# Patient Record
Sex: Male | Born: 1970 | Race: Black or African American | Hispanic: No | Marital: Single | Smoking: Former smoker
Health system: Southern US, Community
[De-identification: ages and names within clinical notes are randomized; demographics above are authoritative.]

## PROBLEM LIST (undated history)

## (undated) DIAGNOSIS — F32A Depression, unspecified: Secondary | ICD-10-CM

## (undated) DIAGNOSIS — F329 Major depressive disorder, single episode, unspecified: Secondary | ICD-10-CM

## (undated) DIAGNOSIS — K219 Gastro-esophageal reflux disease without esophagitis: Secondary | ICD-10-CM

## (undated) DIAGNOSIS — F952 Tourette's disorder: Secondary | ICD-10-CM

## (undated) DIAGNOSIS — K259 Gastric ulcer, unspecified as acute or chronic, without hemorrhage or perforation: Secondary | ICD-10-CM

## (undated) DIAGNOSIS — F4310 Post-traumatic stress disorder, unspecified: Secondary | ICD-10-CM

## (undated) DIAGNOSIS — G4730 Sleep apnea, unspecified: Secondary | ICD-10-CM

## (undated) DIAGNOSIS — F419 Anxiety disorder, unspecified: Secondary | ICD-10-CM

## (undated) DIAGNOSIS — I10 Essential (primary) hypertension: Secondary | ICD-10-CM

## (undated) DIAGNOSIS — F988 Other specified behavioral and emotional disorders with onset usually occurring in childhood and adolescence: Secondary | ICD-10-CM

## (undated) DIAGNOSIS — E119 Type 2 diabetes mellitus without complications: Secondary | ICD-10-CM

## (undated) HISTORY — DX: Type 2 diabetes mellitus without complications: E11.9

## (undated) HISTORY — PX: ANKLE SURGERY: SHX546

---
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
## 1996-04-02 HISTORY — PX: WRIST SURGERY: SHX841

## 1998-03-23 ENCOUNTER — Inpatient Hospital Stay (HOSPITAL_COMMUNITY): Admission: AD | Admit: 1998-03-23 | Discharge: 1998-04-05 | Payer: Self-pay | Admitting: *Deleted

## 1998-04-25 ENCOUNTER — Emergency Department (HOSPITAL_COMMUNITY): Admission: EM | Admit: 1998-04-25 | Discharge: 1998-04-25 | Payer: Self-pay | Admitting: Emergency Medicine

## 1999-03-06 ENCOUNTER — Emergency Department (HOSPITAL_COMMUNITY): Admission: EM | Admit: 1999-03-06 | Discharge: 1999-03-06 | Payer: Self-pay | Admitting: Emergency Medicine

## 1999-08-21 ENCOUNTER — Encounter: Admission: RE | Admit: 1999-08-21 | Discharge: 1999-11-19 | Payer: Self-pay

## 2000-04-02 HISTORY — PX: ACHILLES TENDON REPAIR: SUR1153

## 2000-05-01 ENCOUNTER — Inpatient Hospital Stay (HOSPITAL_COMMUNITY): Admission: EM | Admit: 2000-05-01 | Discharge: 2000-05-05 | Payer: Self-pay | Admitting: Emergency Medicine

## 2000-05-01 ENCOUNTER — Encounter: Payer: Self-pay | Admitting: Emergency Medicine

## 2000-05-02 ENCOUNTER — Encounter: Payer: Self-pay | Admitting: Surgery

## 2000-05-02 ENCOUNTER — Encounter: Payer: Self-pay | Admitting: Emergency Medicine

## 2000-05-03 ENCOUNTER — Encounter: Payer: Self-pay | Admitting: General Surgery

## 2000-05-04 ENCOUNTER — Encounter: Payer: Self-pay | Admitting: Surgery

## 2000-05-05 ENCOUNTER — Encounter: Payer: Self-pay | Admitting: Surgery

## 2003-02-18 ENCOUNTER — Encounter: Admission: RE | Admit: 2003-02-18 | Discharge: 2003-02-18 | Payer: Self-pay | Admitting: *Deleted

## 2003-02-27 ENCOUNTER — Emergency Department (HOSPITAL_COMMUNITY): Admission: EM | Admit: 2003-02-27 | Discharge: 2003-02-27 | Payer: Self-pay | Admitting: *Deleted

## 2003-09-19 ENCOUNTER — Ambulatory Visit (HOSPITAL_BASED_OUTPATIENT_CLINIC_OR_DEPARTMENT_OTHER): Admission: RE | Admit: 2003-09-19 | Discharge: 2003-09-19 | Payer: Medicare Other | Admitting: Neurology

## 2003-09-29 ENCOUNTER — Ambulatory Visit (HOSPITAL_BASED_OUTPATIENT_CLINIC_OR_DEPARTMENT_OTHER): Admission: RE | Admit: 2003-09-29 | Discharge: 2003-09-29 | Payer: Self-pay | Admitting: Neurology

## 2003-09-29 ENCOUNTER — Emergency Department (HOSPITAL_COMMUNITY): Admission: EM | Admit: 2003-09-29 | Discharge: 2003-09-29 | Payer: Self-pay | Admitting: Family Medicine

## 2003-12-09 ENCOUNTER — Ambulatory Visit (HOSPITAL_COMMUNITY): Payer: Self-pay | Admitting: *Deleted

## 2003-12-10 ENCOUNTER — Ambulatory Visit (HOSPITAL_COMMUNITY): Payer: Self-pay | Admitting: Professional Counselor

## 2003-12-29 ENCOUNTER — Ambulatory Visit (HOSPITAL_COMMUNITY): Payer: Self-pay | Admitting: Psychiatry

## 2004-01-06 ENCOUNTER — Emergency Department (HOSPITAL_COMMUNITY): Admission: EM | Admit: 2004-01-06 | Discharge: 2004-01-06 | Payer: Self-pay | Admitting: Family Medicine

## 2004-02-15 ENCOUNTER — Emergency Department (HOSPITAL_COMMUNITY): Admission: EM | Admit: 2004-02-15 | Discharge: 2004-02-15 | Payer: Self-pay | Admitting: Emergency Medicine

## 2004-02-21 ENCOUNTER — Ambulatory Visit (HOSPITAL_COMMUNITY): Payer: Self-pay | Admitting: Psychiatry

## 2004-04-08 ENCOUNTER — Emergency Department (HOSPITAL_COMMUNITY): Admission: EM | Admit: 2004-04-08 | Discharge: 2004-04-08 | Payer: Self-pay | Admitting: Family Medicine

## 2004-04-28 ENCOUNTER — Ambulatory Visit (HOSPITAL_COMMUNITY): Payer: Self-pay | Admitting: Psychiatry

## 2004-11-30 ENCOUNTER — Emergency Department (HOSPITAL_COMMUNITY): Admission: EM | Admit: 2004-11-30 | Discharge: 2004-11-30 | Payer: Self-pay | Admitting: *Deleted

## 2005-03-08 ENCOUNTER — Ambulatory Visit: Payer: Self-pay | Admitting: Internal Medicine

## 2005-03-09 ENCOUNTER — Ambulatory Visit: Payer: Self-pay | Admitting: Internal Medicine

## 2005-03-09 ENCOUNTER — Ambulatory Visit (HOSPITAL_COMMUNITY): Admission: RE | Admit: 2005-03-09 | Discharge: 2005-03-09 | Payer: Self-pay | Admitting: Internal Medicine

## 2005-03-30 ENCOUNTER — Encounter (INDEPENDENT_AMBULATORY_CARE_PROVIDER_SITE_OTHER): Payer: Self-pay | Admitting: Nurse Practitioner

## 2005-03-30 ENCOUNTER — Ambulatory Visit: Payer: Self-pay | Admitting: Internal Medicine

## 2005-03-30 LAB — CONVERTED CEMR LAB
TSH: 0.988 microintl units/mL
Urinalysis: NEGATIVE

## 2005-04-16 ENCOUNTER — Ambulatory Visit: Payer: Self-pay | Admitting: Internal Medicine

## 2005-05-24 ENCOUNTER — Ambulatory Visit: Payer: Self-pay | Admitting: Internal Medicine

## 2005-07-23 ENCOUNTER — Ambulatory Visit (HOSPITAL_COMMUNITY): Payer: Self-pay | Admitting: Psychiatry

## 2005-07-24 ENCOUNTER — Encounter: Admission: RE | Admit: 2005-07-24 | Discharge: 2005-08-28 | Payer: Self-pay | Admitting: Internal Medicine

## 2005-08-07 ENCOUNTER — Ambulatory Visit (HOSPITAL_COMMUNITY): Payer: Self-pay | Admitting: Psychiatry

## 2005-08-10 ENCOUNTER — Ambulatory Visit: Payer: Self-pay | Admitting: Internal Medicine

## 2005-08-10 ENCOUNTER — Encounter (INDEPENDENT_AMBULATORY_CARE_PROVIDER_SITE_OTHER): Payer: Self-pay | Admitting: Nurse Practitioner

## 2005-08-10 LAB — CONVERTED CEMR LAB: HIV-1 Genotyping, DNA Sequencing: NONREACTIVE

## 2005-08-12 ENCOUNTER — Encounter (INDEPENDENT_AMBULATORY_CARE_PROVIDER_SITE_OTHER): Payer: Self-pay | Admitting: Nurse Practitioner

## 2005-08-12 LAB — CONVERTED CEMR LAB
Chlamydia, Swab/Urine, PCR: NEGATIVE
GC Probe Amp, Urine: NEGATIVE

## 2005-09-06 ENCOUNTER — Ambulatory Visit (HOSPITAL_COMMUNITY): Payer: Self-pay | Admitting: Psychiatry

## 2005-09-20 ENCOUNTER — Ambulatory Visit (HOSPITAL_COMMUNITY): Payer: Self-pay | Admitting: Psychiatry

## 2005-09-28 ENCOUNTER — Encounter: Admission: RE | Admit: 2005-09-28 | Discharge: 2005-09-28 | Payer: Self-pay | Admitting: Gastroenterology

## 2005-10-11 ENCOUNTER — Ambulatory Visit (HOSPITAL_COMMUNITY): Payer: Self-pay | Admitting: Psychiatry

## 2005-10-22 ENCOUNTER — Ambulatory Visit: Payer: Self-pay | Admitting: Internal Medicine

## 2005-11-06 ENCOUNTER — Ambulatory Visit (HOSPITAL_COMMUNITY): Payer: Self-pay | Admitting: Psychiatry

## 2005-11-15 ENCOUNTER — Encounter: Admission: RE | Admit: 2005-11-15 | Discharge: 2005-11-21 | Payer: Self-pay | Admitting: Podiatry

## 2005-12-07 ENCOUNTER — Emergency Department (HOSPITAL_COMMUNITY): Admission: EM | Admit: 2005-12-07 | Discharge: 2005-12-07 | Payer: Self-pay | Admitting: Emergency Medicine

## 2005-12-11 ENCOUNTER — Ambulatory Visit (HOSPITAL_COMMUNITY): Payer: Self-pay | Admitting: Psychiatry

## 2006-01-01 ENCOUNTER — Encounter: Admission: RE | Admit: 2006-01-01 | Discharge: 2006-02-01 | Payer: Self-pay | Admitting: Orthopaedic Surgery

## 2006-01-18 ENCOUNTER — Ambulatory Visit (HOSPITAL_COMMUNITY): Payer: Self-pay | Admitting: Psychiatry

## 2006-02-19 ENCOUNTER — Ambulatory Visit (HOSPITAL_COMMUNITY): Payer: Self-pay | Admitting: Psychiatry

## 2006-02-27 ENCOUNTER — Ambulatory Visit (HOSPITAL_COMMUNITY): Payer: Self-pay | Admitting: Psychiatry

## 2006-04-01 ENCOUNTER — Ambulatory Visit (HOSPITAL_COMMUNITY): Payer: Self-pay | Admitting: Psychiatry

## 2006-04-22 ENCOUNTER — Ambulatory Visit: Payer: Self-pay | Admitting: Internal Medicine

## 2006-05-02 ENCOUNTER — Ambulatory Visit (HOSPITAL_COMMUNITY): Admission: RE | Admit: 2006-05-02 | Discharge: 2006-05-02 | Payer: Self-pay | Admitting: Anesthesiology

## 2006-06-10 ENCOUNTER — Ambulatory Visit: Payer: Self-pay | Admitting: Internal Medicine

## 2006-07-04 IMAGING — RF DG UGI W/ HIGH DENSITY W/KUB
14 series · 14 of 14 positions shown · non-contrast
Comparison: None.

CLINICAL DATA: Weight loss.  
 ABDOMINAL ULTRASOUND COMPLETE:
TECHNIQUE: Complete abdominal ultrasound examination was performed including evaluation of the liver, gallbladder, bile ducts, pancreas, kidneys, spleen, IVC, and abdominal aorta.

[Series 1: run · 1 of 1 slices shown (1 of 14)]
[im 1/1]
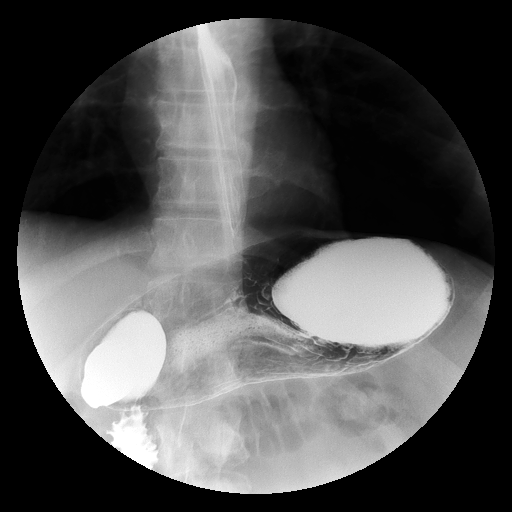

[Series 2: run · 1 of 1 slices shown (2 of 14)]
[im 1/1]
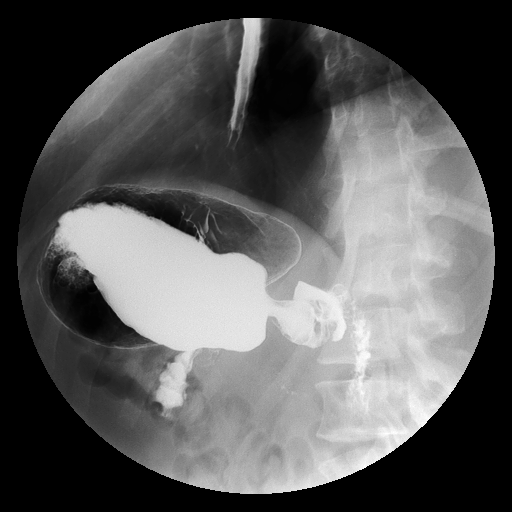

[Series 3: run · 1 of 1 slices shown (3 of 14)]
[im 1/1]
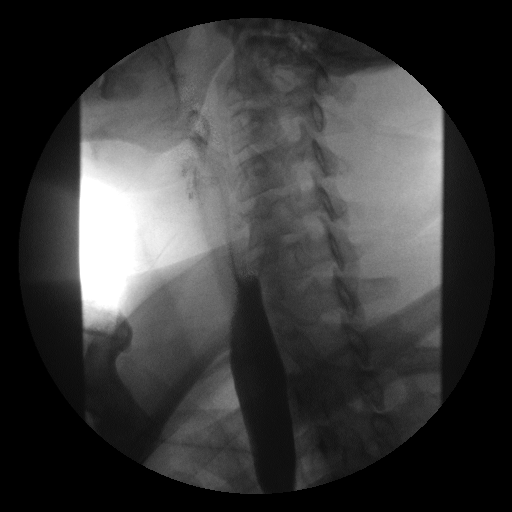

[Series 4: run · 1 of 1 slices shown (4 of 14)]
[im 1/1]
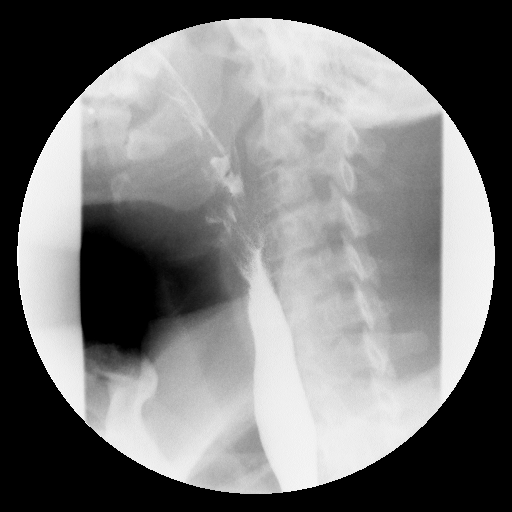

[Series 5: run · 1 of 1 slices shown (5 of 14)]
[im 1/1]
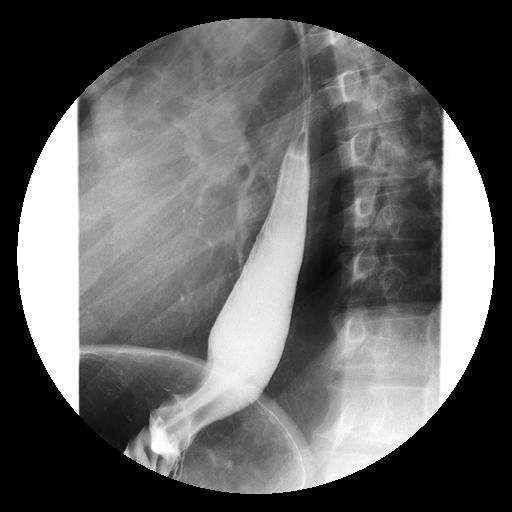

[Series 6: run · 1 of 1 slices shown (6 of 14)]
[im 1/1]
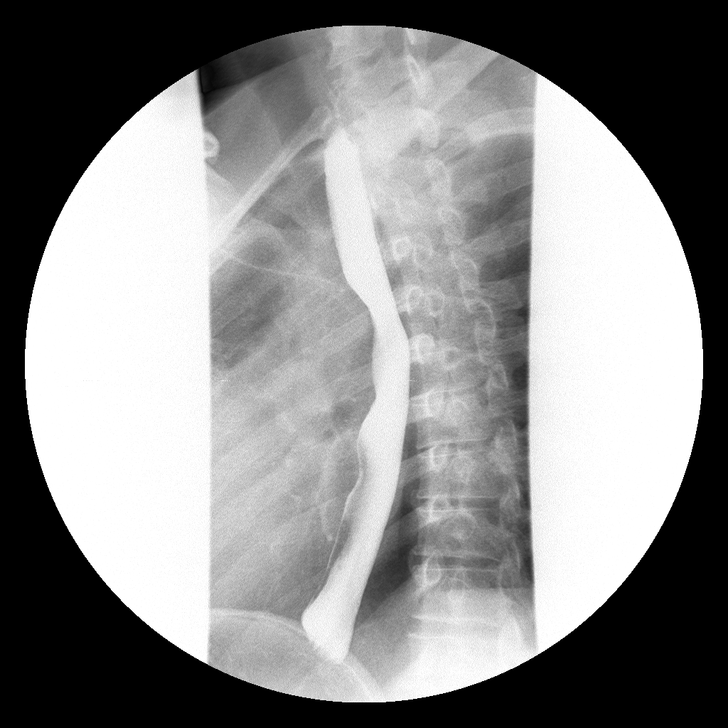

[Series 7: run · 1 of 1 slices shown (7 of 14)]
[im 1/1]
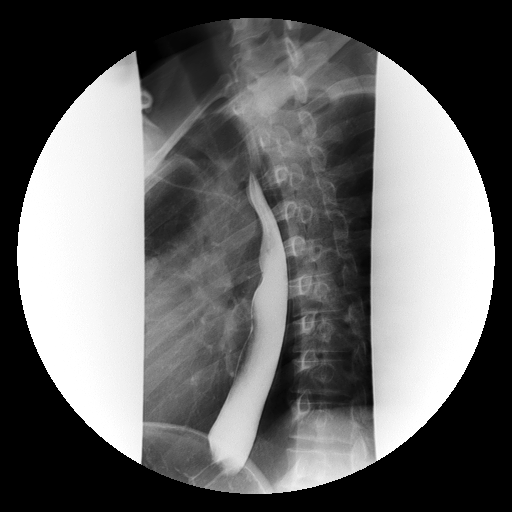

[Series 8: run · 1 of 1 slices shown (8 of 14)]
[im 1/1]
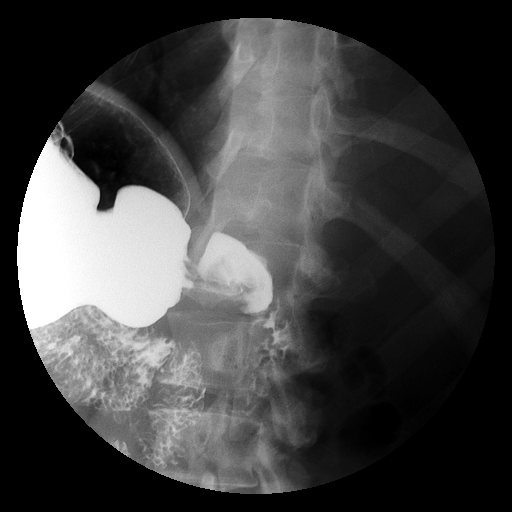

[Series 9: run · 1 of 1 slices shown (9 of 14)]
[im 1/1]
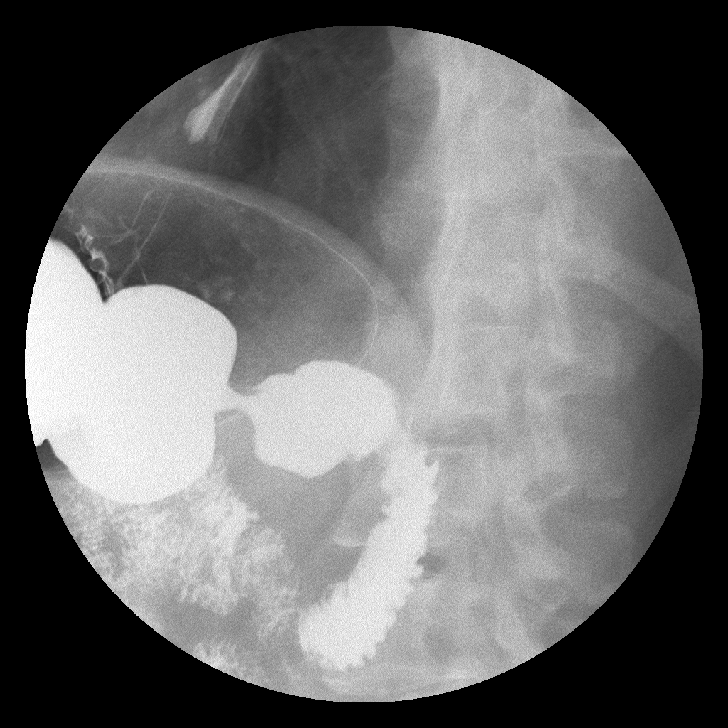

[Series 10: run · 1 of 1 slices shown (10 of 14)]
[im 1/1]
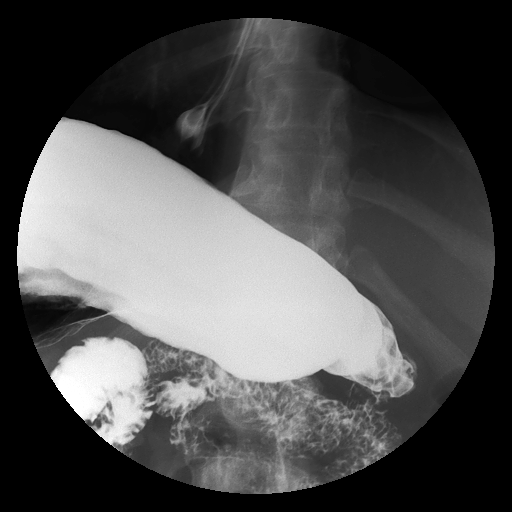

[Series 11: run · 1 of 1 slices shown (11 of 14)]
[im 1/1]
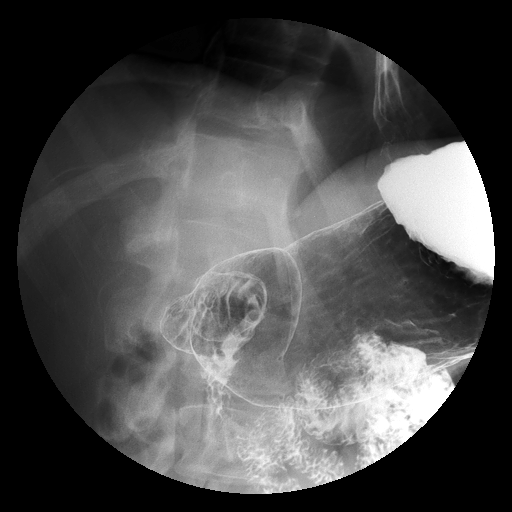

[Series 12: run · 1 of 1 slices shown (12 of 14)]
[im 1/1]
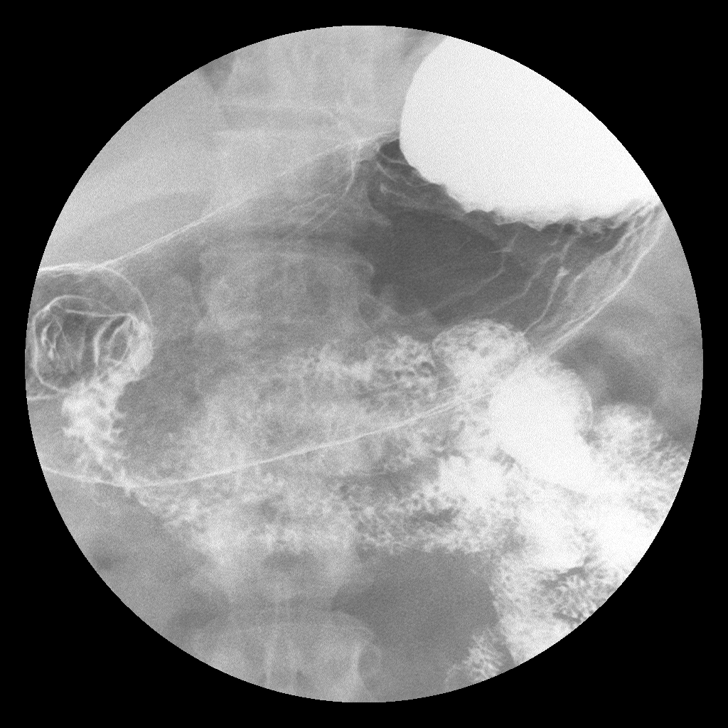

[Series 13: run · 1 of 1 slices shown (13 of 14)]
[im 1/1]
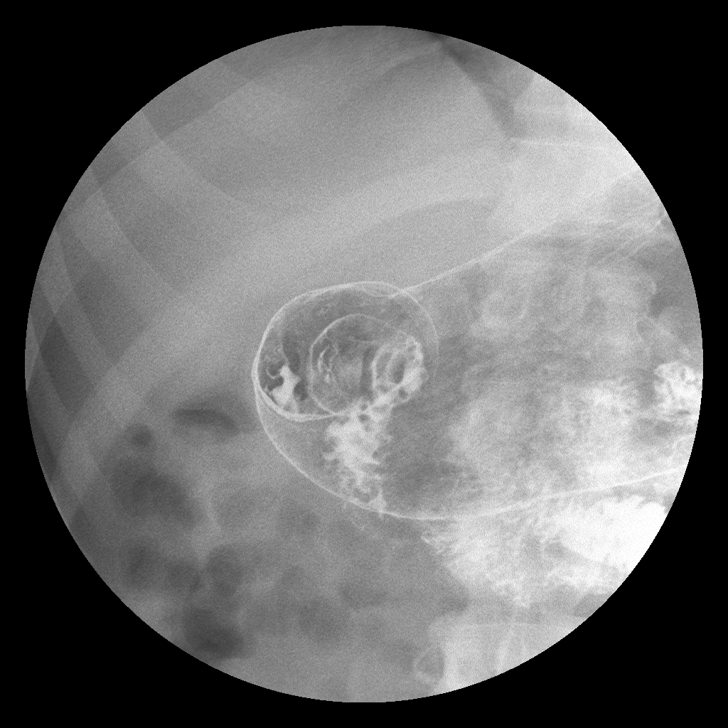

[Series 14: run · 1 of 1 slices shown (14 of 14)]
[im 1/1]
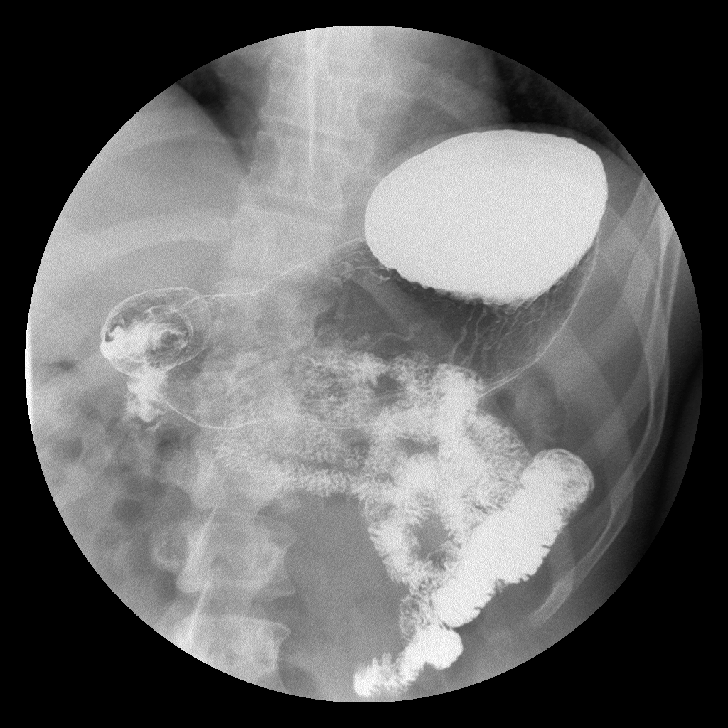

[14 of 14 positions shown; findings below may reference images not displayed]

FINDINGS: The gallbladder size and contour are normal.  There appear to be one or two small polyps at the anterior wall measuring only a few mm in size.  No stones or wall thickening.  No biliary dilatation.  The common duct is 2mm.  
 The liver is normal.  The spleen is normal.  Pancreas is suboptimally visualized due to overlying bowel gas.  The head and body region are well demonstrated but the tail is not well seen.  
 Kidneys unremarkable.  IVC and aorta are unremarkable.  
 One might consider a CT of the abdomen and pelvis to further assess the patient for malignancy, especially since the tail of the pancreas cannot be adequately visualized on this exam.  If this is desired, we would need to wait until the barium administered today, has cleared the GI tract.
IMPRESSION: 1.  There are one or two tiny gallbladder polyps. 
 2.  The tail of the pancreas is not well demonstrated. 
 3.  No other findings of significance.  
 UPPER GI:
 KUB unremarkable. 
 The esophagus is normal in appearance.  There is no evidence of hiatal hernia or gastroesophageal reflux.  esophageal motility is within normal limits.  The stomach, duodenal bulb, and remainder of the duodenum are normal in appearance.  
 IMPRESSION
 Normal upper GI series.

## 2006-07-24 ENCOUNTER — Ambulatory Visit: Payer: Self-pay | Admitting: Internal Medicine

## 2006-08-17 ENCOUNTER — Encounter (INDEPENDENT_AMBULATORY_CARE_PROVIDER_SITE_OTHER): Payer: Self-pay | Admitting: Internal Medicine

## 2006-11-22 ENCOUNTER — Encounter (INDEPENDENT_AMBULATORY_CARE_PROVIDER_SITE_OTHER): Payer: Self-pay | Admitting: Nurse Practitioner

## 2006-11-22 DIAGNOSIS — K279 Peptic ulcer, site unspecified, unspecified as acute or chronic, without hemorrhage or perforation: Secondary | ICD-10-CM | POA: Insufficient documentation

## 2006-11-22 DIAGNOSIS — K5900 Constipation, unspecified: Secondary | ICD-10-CM | POA: Insufficient documentation

## 2006-11-22 DIAGNOSIS — R5381 Other malaise: Secondary | ICD-10-CM

## 2006-11-22 DIAGNOSIS — G43909 Migraine, unspecified, not intractable, without status migrainosus: Secondary | ICD-10-CM | POA: Insufficient documentation

## 2006-11-22 DIAGNOSIS — K602 Anal fissure, unspecified: Secondary | ICD-10-CM | POA: Insufficient documentation

## 2006-11-22 DIAGNOSIS — G4700 Insomnia, unspecified: Secondary | ICD-10-CM | POA: Insufficient documentation

## 2006-11-22 DIAGNOSIS — G4730 Sleep apnea, unspecified: Secondary | ICD-10-CM | POA: Insufficient documentation

## 2006-11-22 DIAGNOSIS — F329 Major depressive disorder, single episode, unspecified: Secondary | ICD-10-CM

## 2006-11-22 DIAGNOSIS — R4789 Other speech disturbances: Secondary | ICD-10-CM | POA: Insufficient documentation

## 2006-11-22 DIAGNOSIS — R5383 Other fatigue: Secondary | ICD-10-CM

## 2006-11-22 DIAGNOSIS — I10 Essential (primary) hypertension: Secondary | ICD-10-CM

## 2007-04-10 ENCOUNTER — Encounter (INDEPENDENT_AMBULATORY_CARE_PROVIDER_SITE_OTHER): Payer: Self-pay | Admitting: Internal Medicine

## 2007-04-30 ENCOUNTER — Emergency Department (HOSPITAL_COMMUNITY): Admission: EM | Admit: 2007-04-30 | Discharge: 2007-04-30 | Payer: Self-pay | Admitting: Family Medicine

## 2007-05-06 ENCOUNTER — Encounter (INDEPENDENT_AMBULATORY_CARE_PROVIDER_SITE_OTHER): Payer: Self-pay | Admitting: Internal Medicine

## 2007-05-25 ENCOUNTER — Encounter (INDEPENDENT_AMBULATORY_CARE_PROVIDER_SITE_OTHER): Payer: Self-pay | Admitting: Internal Medicine

## 2007-06-30 ENCOUNTER — Encounter (INDEPENDENT_AMBULATORY_CARE_PROVIDER_SITE_OTHER): Payer: Self-pay | Admitting: Internal Medicine

## 2007-07-01 ENCOUNTER — Encounter (INDEPENDENT_AMBULATORY_CARE_PROVIDER_SITE_OTHER): Payer: Self-pay | Admitting: Internal Medicine

## 2007-10-27 ENCOUNTER — Emergency Department (HOSPITAL_COMMUNITY): Admission: EM | Admit: 2007-10-27 | Discharge: 2007-10-27 | Payer: Self-pay | Admitting: Emergency Medicine

## 2007-11-11 ENCOUNTER — Telehealth (INDEPENDENT_AMBULATORY_CARE_PROVIDER_SITE_OTHER): Payer: Self-pay | Admitting: Internal Medicine

## 2007-12-16 ENCOUNTER — Ambulatory Visit (HOSPITAL_COMMUNITY): Admission: RE | Admit: 2007-12-16 | Discharge: 2007-12-16 | Payer: Self-pay | Admitting: Orthopedic Surgery

## 2007-12-16 HISTORY — PX: ACHILLES TENDON REPAIR: SUR1153

## 2007-12-25 ENCOUNTER — Ambulatory Visit: Payer: Self-pay | Admitting: Internal Medicine

## 2007-12-25 DIAGNOSIS — F4310 Post-traumatic stress disorder, unspecified: Secondary | ICD-10-CM

## 2007-12-25 LAB — CONVERTED CEMR LAB
ALT: 29 units/L (ref 0–53)
AST: 27 units/L (ref 0–37)
Albumin: 4.8 g/dL (ref 3.5–5.2)
Alkaline Phosphatase: 84 units/L (ref 39–117)
BUN: 16 mg/dL (ref 6–23)
Basophils Absolute: 0 10*3/uL (ref 0.0–0.1)
Basophils Relative: 1 % (ref 0–1)
CO2: 22 meq/L (ref 19–32)
Calcium: 10.3 mg/dL (ref 8.4–10.5)
Chloride: 104 meq/L (ref 96–112)
Creatinine, Ser: 1.29 mg/dL (ref 0.40–1.50)
Eosinophils Absolute: 0.3 10*3/uL (ref 0.0–0.7)
Eosinophils Relative: 5 % (ref 0–5)
Glucose, Bld: 106 mg/dL — ABNORMAL HIGH (ref 70–99)
HCT: 43.5 % (ref 39.0–52.0)
Hemoglobin: 14.7 g/dL (ref 13.0–17.0)
Lymphocytes Relative: 43 % (ref 12–46)
Lymphs Abs: 2.2 10*3/uL (ref 0.7–4.0)
MCHC: 33.8 g/dL (ref 30.0–36.0)
MCV: 87.2 fL (ref 78.0–100.0)
Monocytes Absolute: 0.5 10*3/uL (ref 0.1–1.0)
Monocytes Relative: 10 % (ref 3–12)
Neutro Abs: 2.1 10*3/uL (ref 1.7–7.7)
Neutrophils Relative %: 41 % — ABNORMAL LOW (ref 43–77)
Platelets: 301 10*3/uL (ref 150–400)
Potassium: 3.9 meq/L (ref 3.5–5.3)
RBC: 4.99 M/uL (ref 4.22–5.81)
RDW: 13.4 % (ref 11.5–15.5)
Sodium: 140 meq/L (ref 135–145)
Total Bilirubin: 0.3 mg/dL (ref 0.3–1.2)
Total Protein: 7.9 g/dL (ref 6.0–8.3)
WBC: 5 10*3/uL (ref 4.0–10.5)

## 2008-01-20 ENCOUNTER — Telehealth (INDEPENDENT_AMBULATORY_CARE_PROVIDER_SITE_OTHER): Payer: Self-pay | Admitting: *Deleted

## 2009-06-03 ENCOUNTER — Emergency Department (HOSPITAL_COMMUNITY): Admission: EM | Admit: 2009-06-03 | Discharge: 2009-06-03 | Payer: Self-pay | Admitting: Family Medicine

## 2009-10-07 ENCOUNTER — Encounter: Admission: RE | Admit: 2009-10-07 | Discharge: 2009-12-07 | Payer: Self-pay | Admitting: Orthopedic Surgery

## 2010-01-25 ENCOUNTER — Encounter
Admission: RE | Admit: 2010-01-25 | Discharge: 2010-02-08 | Payer: Self-pay | Source: Home / Self Care | Attending: Family Medicine | Admitting: Family Medicine

## 2010-05-17 ENCOUNTER — Other Ambulatory Visit (HOSPITAL_COMMUNITY): Payer: Self-pay | Admitting: Podiatry

## 2010-05-17 DIAGNOSIS — T148XXA Other injury of unspecified body region, initial encounter: Secondary | ICD-10-CM

## 2010-05-29 ENCOUNTER — Encounter (HOSPITAL_COMMUNITY): Payer: Self-pay

## 2010-05-29 ENCOUNTER — Other Ambulatory Visit (HOSPITAL_COMMUNITY): Payer: Self-pay

## 2010-06-01 ENCOUNTER — Other Ambulatory Visit (HOSPITAL_COMMUNITY): Payer: Self-pay | Admitting: Podiatry

## 2010-06-01 DIAGNOSIS — G5790 Unspecified mononeuropathy of unspecified lower limb: Secondary | ICD-10-CM

## 2010-06-01 DIAGNOSIS — M79671 Pain in right foot: Secondary | ICD-10-CM

## 2010-06-14 ENCOUNTER — Encounter (HOSPITAL_COMMUNITY)
Admission: RE | Admit: 2010-06-14 | Discharge: 2010-06-14 | Disposition: A | Discharge status: Home / Self Care | Payer: Medicare Other | Source: Ambulatory Visit | Attending: Podiatry | Admitting: Podiatry

## 2010-06-14 DIAGNOSIS — M79609 Pain in unspecified limb: Secondary | ICD-10-CM | POA: Insufficient documentation

## 2010-06-14 DIAGNOSIS — M79671 Pain in right foot: Secondary | ICD-10-CM

## 2010-06-14 DIAGNOSIS — Z9889 Other specified postprocedural states: Secondary | ICD-10-CM | POA: Insufficient documentation

## 2010-06-14 DIAGNOSIS — R948 Abnormal results of function studies of other organs and systems: Secondary | ICD-10-CM | POA: Insufficient documentation

## 2010-06-14 DIAGNOSIS — G5790 Unspecified mononeuropathy of unspecified lower limb: Secondary | ICD-10-CM

## 2010-06-14 MED ORDER — TECHNETIUM TC 99M MEDRONATE IV KIT
25.0000 | PACK | Freq: Once | INTRAVENOUS | Status: AC | PRN
  Administered 2010-06-14: 25 via INTRAVENOUS

## 2010-06-14 MED ORDER — TECHNETIUM TC 99M MEDRONATE IV KIT: 25.0000 | PACK | Freq: Once | INTRAVENOUS | Status: DC | PRN

## 2010-06-15 ENCOUNTER — Other Ambulatory Visit (HOSPITAL_COMMUNITY): Payer: Self-pay

## 2010-06-15 ENCOUNTER — Encounter (HOSPITAL_COMMUNITY): Payer: Self-pay

## 2010-06-20 ENCOUNTER — Other Ambulatory Visit (HOSPITAL_COMMUNITY): Payer: Self-pay | Admitting: Family Medicine

## 2010-06-20 ENCOUNTER — Ambulatory Visit (HOSPITAL_COMMUNITY)
Admission: RE | Admit: 2010-06-20 | Discharge: 2010-06-20 | Disposition: A | Discharge status: Home / Self Care | Payer: Medicare Other | Source: Ambulatory Visit | Attending: Family Medicine | Admitting: Family Medicine

## 2010-06-28 ENCOUNTER — Other Ambulatory Visit (HOSPITAL_COMMUNITY): Payer: Medicare Other

## 2010-06-28 ENCOUNTER — Ambulatory Visit (HOSPITAL_COMMUNITY): Payer: Medicare Other

## 2010-06-28 ENCOUNTER — Inpatient Hospital Stay (INDEPENDENT_AMBULATORY_CARE_PROVIDER_SITE_OTHER)
Admission: RE | Admit: 2010-06-28 | Discharge: 2010-06-28 | Disposition: A | Discharge status: Home / Self Care | Payer: Medicare Other | Source: Ambulatory Visit | Attending: Family Medicine | Admitting: Family Medicine

## 2010-06-28 ENCOUNTER — Ambulatory Visit (INDEPENDENT_AMBULATORY_CARE_PROVIDER_SITE_OTHER): Payer: Medicare Other

## 2010-06-28 DIAGNOSIS — M79609 Pain in unspecified limb: Secondary | ICD-10-CM

## 2010-08-15 NOTE — Op Note (Signed)
Maxwell Gardner, ROWE             ACCOUNT NO.:  [id number]   MEDICAL RECORD NO.:  [id number]          PATIENT TYPE:  AMB   LOCATION:  DAY                          FACILITY:  [facility]   PHYSICIAN:  Marlowe Kays, M.D.  DATE OF BIRTH:  19-Oct-1970   DATE OF PROCEDURE:  12/16/2007  DATE OF DISCHARGE:  12/16/2007                               OPERATIVE REPORT   PREOPERATIVE DIAGNOSIS:  Torn Achilles tendon, left ankle.   POSTOPERATIVE DIAGNOSIS:  Torn Achilles tendon, left ankle.   OPERATION:  Repair of torn Achilles tendon, left ankle.   SURGEON:  Marlowe Kays, M.D.   ASSISTANT:  Nurse.   ANESTHESIA.:  General.   PATHOLOGY AND INDICATIONS FOR PROCEDURE:  I saw him in the office on  September 11, having injured his left ankle playing basketball.  He has  a history of an Achilles tendon tear and open repair on the right a  number of years ago and has done well with it.  On exam I was unable to  bring the tendon edges together with plantar flexion of the ankle and  consequently I felt that open repair was indicated.   PROCEDURE:  Satisfactory general anesthesia, prophylactic antibiotics.  While in the supine position I placed the pneumatic tourniquet and  Esmarched the leg out, inflated the tourniquet at 300 mmHg.  We then  placed him in the prone position on rolls and his left leg was prepped  with DuraPrep from just past the knee to toes and draped in a sterile  field.  Time-out performed.  I made a vertical incision just medial to  the midline curving slightly over the medial heel.  The tendon rupture  was identified.  There were just a few intact fibers.  In general his  tendon structure was not healthy.  After clearing the wound of clotted  material, I first brought the wound edges together with a Kessler type  stitch of 0 Ethibond supplemented by circumferential 0 Ethibond sutures.  I then constructed 2 tendinous flaps roughly a centimeter wide and about  7 cm long  beginning about 3 cm proximal to the repair site, and then  flipped these flaps down, rotating them so that the previous rough side  was down and the smooth side was up.  I then sutured them over the  repair site with multiple interrupted 0 Vicryl.  The wound was then  irrigated with sterile saline.  The tendon sheath was approximated when  possible with interrupted 0 Vicryl, the subcutaneous tissue with 2-0  Vicryl, staples in the skin.  Betadine, Adaptic and dry sterile dressing  were applied and a short-leg nonlocking splint cast of the ankle in a  harness.  He tolerated the procedure well and was taken to recovery in  satisfactory condition with no known complications.           ______________________________  Marlowe Kays, M.D.     JA/MEDQ  D:  12/16/2007  T:  12/18/2007  Job:  [id number]
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
## 2010-10-06 ENCOUNTER — Encounter (INDEPENDENT_AMBULATORY_CARE_PROVIDER_SITE_OTHER): Payer: Medicare Other | Admitting: General Surgery

## 2010-11-02 ENCOUNTER — Emergency Department (HOSPITAL_COMMUNITY)
Admission: EM | Admit: 2010-11-02 | Discharge: 2010-11-03 | Disposition: A | Discharge status: Home / Self Care | Payer: Medicare Other | Attending: Emergency Medicine | Admitting: Emergency Medicine

## 2010-11-02 DIAGNOSIS — R1031 Right lower quadrant pain: Secondary | ICD-10-CM | POA: Insufficient documentation

## 2010-11-02 DIAGNOSIS — R42 Dizziness and giddiness: Secondary | ICD-10-CM | POA: Insufficient documentation

## 2010-11-02 DIAGNOSIS — I10 Essential (primary) hypertension: Secondary | ICD-10-CM | POA: Insufficient documentation

## 2010-11-02 DIAGNOSIS — R748 Abnormal levels of other serum enzymes: Secondary | ICD-10-CM | POA: Insufficient documentation

## 2010-11-02 DIAGNOSIS — F952 Tourette's disorder: Secondary | ICD-10-CM | POA: Insufficient documentation

## 2010-11-02 DIAGNOSIS — K219 Gastro-esophageal reflux disease without esophagitis: Secondary | ICD-10-CM | POA: Insufficient documentation

## 2010-11-02 HISTORY — DX: Sleep apnea, unspecified: G47.30

## 2010-11-02 HISTORY — DX: Tourette's disorder: F95.2

## 2010-11-02 HISTORY — DX: Essential (primary) hypertension: I10

## 2010-11-02 LAB — DIFFERENTIAL
Basophils Absolute: 0 10*3/uL (ref 0.0–0.1)
Basophils Relative: 0 % (ref 0–1)
Eosinophils Absolute: 0.1 10*3/uL (ref 0.0–0.7)
Eosinophils Relative: 1 % (ref 0–5)
Lymphocytes Relative: 19 % (ref 12–46)
Lymphs Abs: 1.8 10*3/uL (ref 0.7–4.0)
Monocytes Absolute: 0.7 10*3/uL (ref 0.1–1.0)
Monocytes Relative: 8 % (ref 3–12)
Neutro Abs: 6.8 10*3/uL (ref 1.7–7.7)
Neutrophils Relative %: 73 % (ref 43–77)

## 2010-11-02 LAB — CBC
HCT: 40.1 % (ref 39.0–52.0)
Hemoglobin: 14.3 g/dL (ref 13.0–17.0)
MCH: 30.2 pg (ref 26.0–34.0)
MCHC: 35.7 g/dL (ref 30.0–36.0)
MCV: 84.8 fL (ref 78.0–100.0)
Platelets: 233 10*3/uL (ref 150–400)
RBC: 4.73 MIL/uL (ref 4.22–5.81)
RDW: 12.8 % (ref 11.5–15.5)
WBC: 9.4 10*3/uL (ref 4.0–10.5)

## 2010-11-03 ENCOUNTER — Encounter (HOSPITAL_COMMUNITY): Payer: Self-pay | Admitting: Radiology

## 2010-11-03 ENCOUNTER — Emergency Department (HOSPITAL_COMMUNITY): Payer: Medicare Other

## 2010-11-03 LAB — BASIC METABOLIC PANEL
BUN: 16 mg/dL (ref 6–23)
CO2: 24 mEq/L (ref 19–32)
Calcium: 10 mg/dL (ref 8.4–10.5)
Chloride: 100 mEq/L (ref 96–112)
Creatinine, Ser: 1.52 mg/dL — ABNORMAL HIGH (ref 0.50–1.35)
GFR calc Af Amer: >60 mL/min (ref >60)
GFR calc non Af Amer: 51 mL/min — ABNORMAL LOW (ref >60)
Glucose, Bld: 104 mg/dL — ABNORMAL HIGH (ref 70–99)
Potassium: 3.7 mEq/L (ref 3.5–5.1)
Sodium: 138 mEq/L (ref 135–145)

## 2010-11-03 LAB — URINALYSIS, ROUTINE W REFLEX MICROSCOPIC
Bilirubin Urine: NEGATIVE
Glucose, UA: NEGATIVE mg/dL
Hgb urine dipstick: NEGATIVE
Ketones, ur: 15 mg/dL — AB
Leukocytes, UA: NEGATIVE
Nitrite: NEGATIVE
Protein, ur: NEGATIVE mg/dL
Specific Gravity, Urine: 1.03 (ref 1.005–1.030)
Urobilinogen, UA: 0.2 mg/dL (ref 0.0–1.0)
pH: 5 (ref 5.0–8.0)

## 2010-11-03 LAB — HEPATIC FUNCTION PANEL
ALT: 92 U/L — ABNORMAL HIGH (ref 0–53)
AST: 213 U/L — ABNORMAL HIGH (ref 0–37)
Albumin: 4.5 g/dL (ref 3.5–5.2)
Alkaline Phosphatase: 87 U/L (ref 39–117)
Bilirubin, Direct: 0.1 mg/dL (ref 0.0–0.3)
Indirect Bilirubin: 0.1 mg/dL — ABNORMAL LOW (ref 0.3–0.9)
Total Bilirubin: 0.2 mg/dL — ABNORMAL LOW (ref 0.3–1.2)
Total Protein: 7.9 g/dL (ref 6.0–8.3)

## 2010-11-03 MED ORDER — IOHEXOL 300 MG/ML  SOLN
80.0000 mL | Freq: Once | INTRAMUSCULAR | Status: AC | PRN
  Administered 2010-11-03: 80 mL via INTRAVENOUS

## 2010-11-24 ENCOUNTER — Encounter (INDEPENDENT_AMBULATORY_CARE_PROVIDER_SITE_OTHER): Payer: Medicare Other | Admitting: General Surgery

## 2010-12-15 ENCOUNTER — Encounter (INDEPENDENT_AMBULATORY_CARE_PROVIDER_SITE_OTHER): Payer: Self-pay | Admitting: General Surgery

## 2011-01-10 ENCOUNTER — Telehealth (INDEPENDENT_AMBULATORY_CARE_PROVIDER_SITE_OTHER): Payer: Self-pay | Admitting: General Surgery

## 2011-01-10 NOTE — Telephone Encounter (Signed)
PT CALLED REQUESTING TO SEE DR. Haroldine Laws AND TO HAVE LIDOCAINE 5% OINTMENT REFILLED. REVIEWED WITH DR. HOXWORTH AND HE APPROVED LIDOCAINE 5%OINTMENT. CALLED TO [facility]/ [id number]. PT AWARE.

## 2011-01-15 ENCOUNTER — Encounter (INDEPENDENT_AMBULATORY_CARE_PROVIDER_SITE_OTHER): Payer: Self-pay | Admitting: General Surgery

## 2011-01-15 ENCOUNTER — Ambulatory Visit (INDEPENDENT_AMBULATORY_CARE_PROVIDER_SITE_OTHER): Payer: Medicare Other | Admitting: General Surgery

## 2011-01-15 VITALS — BP 118/88 | HR 78 | Temp 98.0°F | Ht 67.0 in | Wt 161.4 lb

## 2011-01-15 DIAGNOSIS — K602 Anal fissure, unspecified: Secondary | ICD-10-CM

## 2011-01-15 NOTE — Patient Instructions (Signed)
Call for any questions or concerns before her surgery

## 2011-01-15 NOTE — Progress Notes (Signed)
Subjective:   Anal pain  Patient ID: Maxwell Gardner, male   DOB: [birth year], 40 y.o.   MRN: [id number]  HPI Patient returns to the office with persistent and recurrent anal pain. He has been treated here intermittently for about 2 years with symptoms classic for a fissure. This initially responded well to diltiazem cream but now since this summer he has had more persistent discomfort. I saw him in June and he got some mild relief from diltiazem but now has continued burning and sharp pain with bowel movements and occasional small amount of bleeding.  Past Medical History  Diagnosis Date  . Tourette's syndrome   . Hypertension   . Sleep apnea    Past Surgical History  Procedure Date  . Achilles tendon repair 2009    right ankle  . Achilles tendon repair 2002    left ankle  . Wrist surgery 1998    right  . Hernia repair 1992    right ing   Current Outpatient Prescriptions  Medication Sig Dispense Refill  . Eszopiclone (ESZOPICLONE) 3 MG TABS Take 3 mg by mouth at bedtime. Take immediately before bedtime       . lidocaine (LIDODERM) 5 % Place 1 patch onto the skin as needed. Remove & Discard patch within 12 hours or as directed by MD       . LISINOPRIL PO Take by mouth at bedtime.         No Known Allergies History  Substance Use Topics  . Smoking status: Former Smoker    Quit date: 01/15/1995  . Smokeless tobacco: Not on file  . Alcohol Use: Yes     3-4 drinks a weekend    Review of Systems  Constitutional: Negative.   Respiratory: Negative.   Cardiovascular: Negative.   Gastrointestinal: Negative.   Musculoskeletal: Positive for arthralgias.       Objective:   Physical Exam General: Well-developed African American male in no distress Skin: Warm and dry without rash or infection Lungs: Clear no wheezing or increased work of breathing Cardiac: Regular rate and rhythm without murmur. No edema or JVD Abdomen: Soft and nontender. No masses organomegaly  detected Rectal: Very tender. Not able to be digital rectal exam. Internal sphincter feels hypertrophied. Extremities: No joint swelling or deformity Neurologic: Alert fully oriented gait normal    Assessment:     Persistent and recurrent anal pain very typical for a fissure with exam consistent with fissure and no other cause for his pain identified. As he is not responding to medical management I recommended proceeding to exam under anesthesia and a fissure is confirmed proceeding with lateral internal anal sphincterotomy. We discussed the nature of the procedure and expected recovery and high likelihood that this would allow his fissure to heal. Risks of bleeding, infection, anesthetic complications, nonhealing, and degrees of incontinence were discussed and understood. We will schedule this at his convenience.    Plan:     Exam under anesthesia and probable internal anal sphincterotomy for fissure as an outpatient

## 2011-01-30 ENCOUNTER — Ambulatory Visit (HOSPITAL_BASED_OUTPATIENT_CLINIC_OR_DEPARTMENT_OTHER): Admission: RE | Admit: 2011-01-30 | Payer: Medicare Other | Source: Ambulatory Visit | Admitting: General Surgery

## 2011-01-31 HISTORY — PX: ANTERIOR CRUCIATE LIGAMENT REPAIR: SHX115

## 2011-02-15 ENCOUNTER — Other Ambulatory Visit (INDEPENDENT_AMBULATORY_CARE_PROVIDER_SITE_OTHER): Payer: Self-pay

## 2011-03-20 ENCOUNTER — Encounter (HOSPITAL_BASED_OUTPATIENT_CLINIC_OR_DEPARTMENT_OTHER): Payer: Self-pay | Admitting: *Deleted

## 2011-03-20 NOTE — Pre-Procedure Instructions (Signed)
To come for BMET and EKG  To bring CPAP machine DOS

## 2011-03-28 ENCOUNTER — Other Ambulatory Visit: Payer: Self-pay

## 2011-03-28 ENCOUNTER — Ambulatory Visit (HOSPITAL_BASED_OUTPATIENT_CLINIC_OR_DEPARTMENT_OTHER): Payer: Medicare Other | Admitting: *Deleted

## 2011-03-28 ENCOUNTER — Other Ambulatory Visit (INDEPENDENT_AMBULATORY_CARE_PROVIDER_SITE_OTHER): Payer: Self-pay | Admitting: General Surgery

## 2011-03-28 ENCOUNTER — Encounter (HOSPITAL_BASED_OUTPATIENT_CLINIC_OR_DEPARTMENT_OTHER)
Admission: RE | Disposition: A | Discharge status: Home / Self Care | Payer: Self-pay | Source: Ambulatory Visit | Attending: General Surgery

## 2011-03-28 ENCOUNTER — Encounter (HOSPITAL_BASED_OUTPATIENT_CLINIC_OR_DEPARTMENT_OTHER): Payer: Self-pay | Admitting: *Deleted

## 2011-03-28 ENCOUNTER — Ambulatory Visit (HOSPITAL_BASED_OUTPATIENT_CLINIC_OR_DEPARTMENT_OTHER)
Admission: RE | Admit: 2011-03-28 | Discharge: 2011-03-28 | Disposition: A | Discharge status: Home / Self Care | Payer: Medicare Other | Source: Ambulatory Visit | Attending: General Surgery | Admitting: General Surgery

## 2011-03-28 ENCOUNTER — Encounter (HOSPITAL_BASED_OUTPATIENT_CLINIC_OR_DEPARTMENT_OTHER): Payer: Self-pay

## 2011-03-28 DIAGNOSIS — G4730 Sleep apnea, unspecified: Secondary | ICD-10-CM | POA: Insufficient documentation

## 2011-03-28 DIAGNOSIS — Z8711 Personal history of peptic ulcer disease: Secondary | ICD-10-CM | POA: Insufficient documentation

## 2011-03-28 DIAGNOSIS — R51 Headache: Secondary | ICD-10-CM | POA: Insufficient documentation

## 2011-03-28 DIAGNOSIS — I10 Essential (primary) hypertension: Secondary | ICD-10-CM | POA: Insufficient documentation

## 2011-03-28 DIAGNOSIS — K219 Gastro-esophageal reflux disease without esophagitis: Secondary | ICD-10-CM | POA: Insufficient documentation

## 2011-03-28 DIAGNOSIS — K602 Anal fissure, unspecified: Secondary | ICD-10-CM | POA: Insufficient documentation

## 2011-03-28 DIAGNOSIS — Z01810 Encounter for preprocedural cardiovascular examination: Secondary | ICD-10-CM | POA: Insufficient documentation

## 2011-03-28 DIAGNOSIS — K620 Anal polyp: Secondary | ICD-10-CM | POA: Insufficient documentation

## 2011-03-28 HISTORY — DX: Anxiety disorder, unspecified: F41.9

## 2011-03-28 HISTORY — DX: Major depressive disorder, single episode, unspecified: F32.9

## 2011-03-28 HISTORY — PX: SPHINCTEROTOMY: SHX5279

## 2011-03-28 HISTORY — PX: EXAMINATION UNDER ANESTHESIA: SHX1540

## 2011-03-28 HISTORY — DX: Post-traumatic stress disorder, unspecified: F43.10

## 2011-03-28 HISTORY — DX: Depression, unspecified: F32.A

## 2011-03-28 HISTORY — DX: Gastro-esophageal reflux disease without esophagitis: K21.9

## 2011-03-28 HISTORY — DX: Other specified behavioral and emotional disorders with onset usually occurring in childhood and adolescence: F98.8

## 2011-03-28 HISTORY — DX: Gastric ulcer, unspecified as acute or chronic, without hemorrhage or perforation: K25.9

## 2011-03-28 LAB — POCT I-STAT, CHEM 8
Creatinine, Ser: 1.3 mg/dL (ref 0.50–1.35)
HCT: 39 % (ref 39.0–52.0)
Hemoglobin: 13.3 g/dL (ref 13.0–17.0)
Potassium: 4.2 mEq/L (ref 3.5–5.1)
Sodium: 138 mEq/L (ref 135–145)

## 2011-03-28 SURGERY — EXAM UNDER ANESTHESIA
Anesthesia: Choice | Wound class: Clean Contaminated

## 2011-03-28 MED ORDER — LIDOCAINE 5 % EX OINT: TOPICAL_OINTMENT | CUTANEOUS | Status: AC | PRN

## 2011-03-28 MED ORDER — BUPIVACAINE HCL 0.25 % IJ SOLN
INTRAMUSCULAR | Status: DC | PRN
  Administered 2011-03-28: 30 mL

## 2011-03-28 MED ORDER — OXYCODONE-ACETAMINOPHEN 10-325 MG PO TABS: 1.0000 | ORAL_TABLET | ORAL | Status: AC | PRN

## 2011-03-28 MED ORDER — MIDAZOLAM HCL 5 MG/5ML IJ SOLN
INTRAMUSCULAR | Status: DC | PRN
  Administered 2011-03-28: 2 mg via INTRAVENOUS

## 2011-03-28 MED ORDER — DEXTROSE 5 % IV SOLN: 2.0000 g | INTRAVENOUS | Status: DC

## 2011-03-28 MED ORDER — HYDROMORPHONE HCL PF 1 MG/ML IJ SOLN: 0.2500 mg | INTRAMUSCULAR | Status: DC | PRN

## 2011-03-28 MED ORDER — FENTANYL CITRATE 0.05 MG/ML IJ SOLN
INTRAMUSCULAR | Status: DC | PRN
  Administered 2011-03-28: 50 ug via INTRAVENOUS
  Administered 2011-03-28: 100 ug via INTRAVENOUS

## 2011-03-28 MED ORDER — LIDOCAINE HCL (CARDIAC) 20 MG/ML IV SOLN
INTRAVENOUS | Status: DC | PRN
  Administered 2011-03-28: 60 mg via INTRAVENOUS

## 2011-03-28 MED ORDER — MEPERIDINE HCL 25 MG/ML IJ SOLN: 6.2500 mg | INTRAMUSCULAR | Status: DC | PRN

## 2011-03-28 MED ORDER — PROPOFOL 10 MG/ML IV EMUL
INTRAVENOUS | Status: DC | PRN
  Administered 2011-03-28: 200 mg via INTRAVENOUS

## 2011-03-28 MED ORDER — SUCCINYLCHOLINE CHLORIDE 20 MG/ML IJ SOLN
INTRAMUSCULAR | Status: DC | PRN
  Administered 2011-03-28: 100 mg via INTRAVENOUS

## 2011-03-28 MED ORDER — LACTATED RINGERS IV SOLN
INTRAVENOUS | Status: DC
  Administered 2011-03-28 (×2): via INTRAVENOUS

## 2011-03-28 MED ORDER — DEXAMETHASONE SODIUM PHOSPHATE 4 MG/ML IJ SOLN
INTRAMUSCULAR | Status: DC | PRN
  Administered 2011-03-28: 10 mg via INTRAVENOUS

## 2011-03-28 MED ORDER — PROMETHAZINE HCL 25 MG/ML IJ SOLN: 6.2500 mg | INTRAMUSCULAR | Status: DC | PRN

## 2011-03-28 MED ORDER — ONDANSETRON HCL 4 MG/2ML IJ SOLN
INTRAMUSCULAR | Status: DC | PRN
  Administered 2011-03-28: 4 mg via INTRAVENOUS

## 2011-03-28 SURGICAL SUPPLY — 44 items
BLADE MINI RND TIP GREEN BEAV (BLADE) IMPLANT
BLADE SURG 15 STRL LF DISP TIS (BLADE) ×1 IMPLANT
BLADE SURG 15 STRL SS (BLADE) ×2
BRIEF STRETCH FOR OB PAD LRG (UNDERPADS AND DIAPERS) ×1 IMPLANT
CANISTER SUCTION 1200CC (MISCELLANEOUS) IMPLANT
CLEANER CAUTERY TIP 5X5 PAD (MISCELLANEOUS) ×1 IMPLANT
CLOTH BEACON ORANGE TIMEOUT ST (SAFETY) ×2 IMPLANT
COVER MAYO STAND STRL (DRAPES) IMPLANT
DECANTER SPIKE VIAL GLASS SM (MISCELLANEOUS) ×1 IMPLANT
DRAPE UTILITY XL STRL (DRAPES) ×2 IMPLANT
DRSG PAD ABDOMINAL 8X10 ST (GAUZE/BANDAGES/DRESSINGS) ×1 IMPLANT
ELECT REM PT RETURN 9FT ADLT (ELECTROSURGICAL) ×2
ELECTRODE REM PT RTRN 9FT ADLT (ELECTROSURGICAL) ×1 IMPLANT
GAUZE SPONGE 4X4 12PLY STRL LF (GAUZE/BANDAGES/DRESSINGS) ×2 IMPLANT
GLOVE BIOGEL PI IND STRL 8 (GLOVE) ×1 IMPLANT
GLOVE BIOGEL PI INDICATOR 8 (GLOVE) ×1
GLOVE SKINSENSE NS SZ7.5 (GLOVE) ×2
GLOVE SKINSENSE STRL SZ7.5 (GLOVE) IMPLANT
GLOVE SS BIOGEL STRL SZ 7.5 (GLOVE) ×1 IMPLANT
GLOVE SUPERSENSE BIOGEL SZ 7.5 (GLOVE)
GOWN PREVENTION PLUS XLARGE (GOWN DISPOSABLE) ×2 IMPLANT
GOWN PREVENTION PLUS XXLARGE (GOWN DISPOSABLE) ×3 IMPLANT
NDL HYPO 25X1 1.5 SAFETY (NEEDLE) ×1 IMPLANT
NEEDLE HYPO 25X1 1.5 SAFETY (NEEDLE) ×2 IMPLANT
PACK BASIN DAY SURGERY FS (CUSTOM PROCEDURE TRAY) ×2 IMPLANT
PACK LITHOTOMY IV (CUSTOM PROCEDURE TRAY) ×2 IMPLANT
PAD CLEANER CAUTERY TIP 5X5 (MISCELLANEOUS) ×1
PENCIL BUTTON HOLSTER BLD 10FT (ELECTRODE) ×2 IMPLANT
PENCIL FOOT CONTROL (ELECTRODE) ×1 IMPLANT
RUBBERBAND STERILE (MISCELLANEOUS) IMPLANT
SURGILUBE 2OZ TUBE FLIPTOP (MISCELLANEOUS) ×5 IMPLANT
SUT CHROMIC 3 0 SH 27 (SUTURE) IMPLANT
SUT SILK 2 0 TIES 17X18 (SUTURE)
SUT SILK 2-0 18XBRD TIE BLK (SUTURE) IMPLANT
SUT VICRYL 3-0 CR8 SH (SUTURE) IMPLANT
SYR CONTROL 10ML LL (SYRINGE) ×2 IMPLANT
TOWEL OR 17X24 6PK STRL BLUE (TOWEL DISPOSABLE) ×3 IMPLANT
TOWEL OR NON WOVEN STRL DISP B (DISPOSABLE) ×2 IMPLANT
TRAY DSU PREP LF (CUSTOM PROCEDURE TRAY) ×2 IMPLANT
TRAY PROCTOSCOPIC FIBER OPTIC (SET/KITS/TRAYS/PACK) IMPLANT
TUBE CONNECTING 20X1/4 (TUBING) IMPLANT
UNDERPAD 30X30 INCONTINENT (UNDERPADS AND DIAPERS) ×2 IMPLANT
WATER STERILE IRR 1000ML POUR (IV SOLUTION) ×2 IMPLANT
YANKAUER SUCT BULB TIP NO VENT (SUCTIONS) IMPLANT

## 2011-03-28 NOTE — Transfer of Care (Signed)
Immediate Anesthesia Transfer of Care Note  Patient: Maxwell Gardner  Procedure(s) Performed:  Francia Greaves UNDER ANESTHESIA - exam under anesthesia, anal sphincterotomy; SPHINCTEROTOMY  Patient Location: PACU  Anesthesia Type: General  Level of Consciousness: awake, alert , oriented and patient cooperative  Airway & Oxygen Therapy: Patient Spontanous Breathing and Patient connected to face mask oxygen  Post-op Assessment: Report given to PACU RN, Post -op Vital signs reviewed and stable and Patient moving all extremities X 4  Post vital signs: Reviewed and stable  Complications: No apparent anesthesia complications

## 2011-03-28 NOTE — Anesthesia Postprocedure Evaluation (Signed)
  Anesthesia Post-op Note  Patient: Maxwell Gardner  Procedure(s) Performed:  Francia Greaves UNDER ANESTHESIA - exam under anesthesia, anal sphincterotomy; SPHINCTEROTOMY  Patient Location: PACU  Anesthesia Type: General  Level of Consciousness: awake and alert   Airway and Oxygen Therapy: Patient Spontanous Breathing  Post-op Pain: mild  Post-op Assessment: Post-op Vital signs reviewed, Patient's Cardiovascular Status Stable, Respiratory Function Stable and Patent Airway  Post-op Vital Signs: Reviewed and stable  Complications: No apparent anesthesia complications

## 2011-03-28 NOTE — Anesthesia Procedure Notes (Signed)
Procedure Name: Intubation Date/Time: 03/28/2011 2:40 PM Performed by: Elverda Wendel D Pre-anesthesia Checklist: Patient identified, Emergency Drugs available, Suction available and Patient being monitored Patient Re-evaluated:Patient Re-evaluated prior to inductionOxygen Delivery Method: Circle System Utilized Preoxygenation: Pre-oxygenation with 100% oxygen Intubation Type: IV induction Ventilation: Mask ventilation without difficulty Laryngoscope Size: 4 Grade View: Grade I Tube type: Oral Tube size: 7.0 mm Number of attempts: 1 Airway Equipment and Method: stylet and video-laryngoscopy Placement Confirmation: ETT inserted through vocal cords under direct vision,  positive ETCO2 and breath sounds checked- equal and bilateral Tube secured with: Tape Dental Injury: Teeth and Oropharynx as per pre-operative assessment  Difficulty Due To: Difficulty was anticipated, Difficult Airway- due to limited oral opening and Difficult Airway- due to large tongue

## 2011-03-28 NOTE — H&P (Signed)
  Subjective:    Maxwell Gardner is a 40 y.o. male who presents for Exam under anesthesia and anal sphincterotomy for chronic anal pain and fissure      Objective:   BP 110/75  Pulse 66  Temp(Src) 97.3 F (36.3 C) (Oral)  Resp 18  Ht 5\' 7"  (1.702 m)  Wt 165 lb (74.844 kg)  BMI 25.84 kg/m2  SpO2 97%  General:  alert and cooperative Skin:  normal Eyes: negative, conjunctivae/corneas clear. PERRL, EOM's intact. Fundi benign. Mouth: MMM no lesions  Lungs:  clear to auscultation bilaterally Heart:  S1, S2 normal Abdomen: normal findings: soft, non-tender Rectal: Tender, probable fissure   Psychiatric:  normal mood, behavior, speech, dress, and thought processes    Assessment:Anal fissure    Plan:Sphincterotomy   1. Discussed the risk of surgery,  and the risks of general anesthetic including MI, CVA, sudden death or even reaction to anesthetic medications. The patient understands the risks, any and all questions were answered to the patient's satisfaction.  Mariella Saa MD, FACS  03/28/2011, 2:30 PM

## 2011-03-28 NOTE — Anesthesia Preprocedure Evaluation (Addendum)
Anesthesia Evaluation  Patient identified by MRN, date of birth, ID band Patient awake    Reviewed: Allergy & Precautions, H&P , NPO status , Patient's Chart, lab work & pertinent test results  Airway Mallampati: II TM Distance: >3 FB Neck ROM: Full    Dental No notable dental hx. (+) Teeth Intact   Pulmonary sleep apnea and Continuous Positive Airway Pressure Ventilation ,  clear to auscultation  Pulmonary exam normal       Cardiovascular hypertension, On Medications and On Home Beta Blockers Regular Normal    Neuro/Psych  Headaches, PSYCHIATRIC DISORDERS    GI/Hepatic PUD, GERD-  Medicated and Controlled,  Endo/Other    Renal/GU   Genitourinary negative   Musculoskeletal   Abdominal   Peds  Hematology negative hematology ROS (+)   Anesthesia Other Findings   Reproductive/Obstetrics negative OB ROS                           Anesthesia Physical Anesthesia Plan  ASA: III  Anesthesia Plan: General ETT   Post-op Pain Management:    Induction: Intravenous  Airway Management Planned: Oral ETT  Additional Equipment:   Intra-op Plan:   Post-operative Plan: Extubation in OR  Informed Consent: I have reviewed the patients History and Physical, chart, labs and discussed the procedure including the risks, benefits and alternatives for the proposed anesthesia with the patient or authorized representative who has indicated his/her understanding and acceptance.   Dental Advisory Given  Plan Discussed with: CRNA  Anesthesia Plan Comments:      Anesthesia Quick Evaluation

## 2011-03-28 NOTE — Op Note (Signed)
Preoperative Diagnosis: anal fissure  Postoprative Diagnosis: anal fissure and polyp or skin tag  Procedure: Procedure(s): EXAM UNDER ANESTHESIA SPHINCTEROTOMY  excision of anal polyp or skin tag Surgeon: Glenna Fellows T   Assistants: none  Anesthesia:  General LMA anesthesiaDiagnos  Indications: patient is a 40 year old male who has had intermittent anal pain and bleeding for at least 2 years which has temporarily responded to treatment for  Fissure. He now presents with worsening symptoms and examination reveals a hypertrophied internal anal sphincter and evidence of a posterior chronic fissure. I recommended proceeding with exam under anesthesia and probable anal sphincterotomy. We've discussed the indications of the procedure, its nature, recovery, and risks of bleeding, infection, anesthetic risks, and potential degrees of incontinence. He understands and desires to proceed.    Procedure Detail: patient is brought to the operating room and general anesthesia induced. He was positioned carefully in lithotomy position and the perineum sterilely prepped and draped. He received preoperative antibiotics. Examination of the anus revealed a definitely tight hypertrophied internal sphincter. There was a definite chronic appearing posterior midline fissure. There was also a prominent about 1/2 cm anal polyp or skin tag at the dentate line in the anterior midline. I felt that this should be excised as well. Initially the intersphincteric groove was identified on the left side and a small incision made. A hemostat was placed into the groove and the hypertrophied portion of the internal sphincter elevated up into the wound and divided with cautery. This produced relaxation of the sphincter back to normal. Hemostasis was obtained with pressure. The anal polyp or skin tag was excised at its base and cauterized and sent for pathology. A perianal block with Marcaine with epinephrine was performed.  Hemostasis was obtained. Patient was taken to PACU in good condition.    Findings: See above  Estimated Blood Loss:  Minimal         Drains: none  Blood Given: none          Specimens: anal polyp or skin tag        Complications:  * No complications entered in OR log *         Disposition: PACU - hemodynamically stable.         Condition: stable  Mariella Saa MD, FACS  03/28/2011, 3:09 PM

## 2011-03-29 ENCOUNTER — Telehealth (INDEPENDENT_AMBULATORY_CARE_PROVIDER_SITE_OTHER): Payer: Self-pay

## 2011-03-29 NOTE — Telephone Encounter (Signed)
Pt calling to request a change in his pain medicine from Dr. Johna Sheriff.  He had surgery yesterday and the Percocet is making him dizzy.  I left a message with Marcelino Duster to contact the patient.

## 2011-04-02 ENCOUNTER — Encounter (HOSPITAL_BASED_OUTPATIENT_CLINIC_OR_DEPARTMENT_OTHER): Payer: Self-pay | Admitting: General Surgery

## 2011-04-12 ENCOUNTER — Telehealth (INDEPENDENT_AMBULATORY_CARE_PROVIDER_SITE_OTHER): Payer: Self-pay | Admitting: General Surgery

## 2011-04-23 ENCOUNTER — Telehealth (INDEPENDENT_AMBULATORY_CARE_PROVIDER_SITE_OTHER): Payer: Self-pay | Admitting: General Surgery

## 2011-04-24 NOTE — Telephone Encounter (Signed)
Patient has been r/s to 05/11/11 @ 9:30 w/Dr. Johna Sheriff

## 2011-04-25 ENCOUNTER — Encounter (INDEPENDENT_AMBULATORY_CARE_PROVIDER_SITE_OTHER): Payer: Medicare Other | Admitting: General Surgery

## 2011-05-11 ENCOUNTER — Encounter (INDEPENDENT_AMBULATORY_CARE_PROVIDER_SITE_OTHER): Payer: Medicare Other | Admitting: General Surgery

## 2011-05-15 ENCOUNTER — Encounter (INDEPENDENT_AMBULATORY_CARE_PROVIDER_SITE_OTHER): Payer: Self-pay | Admitting: General Surgery

## 2011-08-15 ENCOUNTER — Telehealth (INDEPENDENT_AMBULATORY_CARE_PROVIDER_SITE_OTHER): Payer: Self-pay

## 2011-08-15 NOTE — Telephone Encounter (Addendum)
Patient came into office today requesting to see Dr. Johna Sheriff, patient reports he's having some bleeding with bowel movements, patient given appointment for 08/16/11 @ 11:15 am w/Dr. Johna Sheriff.  Patient instructed to go to ER if he has an increase in symptoms, bleeding or pain until seen in our office on 08/16/11 w/Dr. Johna Sheriff.

## 2011-08-16 ENCOUNTER — Encounter (INDEPENDENT_AMBULATORY_CARE_PROVIDER_SITE_OTHER): Payer: Medicare Other | Admitting: General Surgery

## 2011-08-16 ENCOUNTER — Telehealth (INDEPENDENT_AMBULATORY_CARE_PROVIDER_SITE_OTHER): Payer: Self-pay | Admitting: General Surgery

## 2011-10-19 ENCOUNTER — Encounter (INDEPENDENT_AMBULATORY_CARE_PROVIDER_SITE_OTHER): Payer: Medicare Other | Admitting: General Surgery

## 2011-10-22 ENCOUNTER — Encounter (INDEPENDENT_AMBULATORY_CARE_PROVIDER_SITE_OTHER): Payer: Self-pay | Admitting: General Surgery

## 2011-12-21 ENCOUNTER — Telehealth (INDEPENDENT_AMBULATORY_CARE_PROVIDER_SITE_OTHER): Payer: Self-pay

## 2011-12-21 NOTE — Telephone Encounter (Signed)
Pt calling in today c/o fissure pain and requesting appt with Dr Johna Sheriff ASAP. I advised pt that I would speak to [facility] to see if she would give me an appt for October and she did. I made him an appt for 01-24-12 but I told him he does have a hx of canceling w/no show appt's so he needs to keep this appt just made.

## 2011-12-27 ENCOUNTER — Telehealth (INDEPENDENT_AMBULATORY_CARE_PROVIDER_SITE_OTHER): Payer: Self-pay | Admitting: General Surgery

## 2011-12-27 NOTE — Telephone Encounter (Signed)
Pt called to report he is having a lot of pain; appt is with Dr. Johna Sheriff on 01/24/12.  He is having a lot of pain, is following the protocols for stool softener, warm sitz soaks, etc.  He requested refill of Lidocaine cream.  Dr. Johna Sheriff ordered Lidocaine 5% ointment, 30 gr tube, AAA BID, no refill.  Called to [facility]:  [phone].  Pt is aware,

## 2012-01-24 ENCOUNTER — Encounter (INDEPENDENT_AMBULATORY_CARE_PROVIDER_SITE_OTHER): Payer: Medicare Other | Admitting: General Surgery

## 2012-01-24 NOTE — Progress Notes (Signed)
This encounter was created in error - please disregard.

## 2012-01-31 ENCOUNTER — Encounter (INDEPENDENT_AMBULATORY_CARE_PROVIDER_SITE_OTHER): Payer: Self-pay | Admitting: General Surgery

## 2012-01-31 ENCOUNTER — Ambulatory Visit (INDEPENDENT_AMBULATORY_CARE_PROVIDER_SITE_OTHER): Payer: Medicare Other | Admitting: General Surgery

## 2012-01-31 VITALS — BP 124/62 | HR 84 | Temp 97.0°F | Resp 16 | Ht 67.0 in | Wt 164.4 lb

## 2012-01-31 DIAGNOSIS — K6289 Other specified diseases of anus and rectum: Secondary | ICD-10-CM

## 2012-01-31 MED ORDER — HYDROCORTISONE ACETATE 25 MG RE SUPP: 25.0000 mg | Freq: Two times a day (BID) | RECTAL | Status: AC

## 2012-01-31 NOTE — Progress Notes (Signed)
Subjective:     Patient ID: Maxwell Gardner, male   DOB: Sep 21, 1970, 41 y.o.   MRN: [id number]  HPI This is a 41 year old male with multiple medical issues who had a chronic fissure and was taken to the operating room in December of 2012 were underwent excision of an anal skin tag as well as a sphincterotomy. He states that he did get better after this although he still has some occasional discomfort when he is having a bowel movement. Lately he has begun having some bright red blood with each bowel movement. He claims he has soft bowel movements every day but he is doing some straining at times. He does not have the pain that was present quite like when he had the fissure before but it is uncomfortable. He had been using lidocaine jelly and Tucks pads with no real help for this. He comes in today to discuss any other options. He states he also has had a previous colonoscopy.  Review of Systems     Objective:   Physical Exam Anal rectal exam shows him to have minimal anal skin tags externally He has a little bit of a pile posteriorly back it really identify an area where it looks like he has an acute or chronic fissure His rectal exam shows no mass although it is very tight I think this may very well be secondary to his discomfort is he was really unable to tolerate the exam well. I did not do an anoscopy due to back.    Assessment:     Anal pain    Plan:     I really dont think he has a recurrent fissure in all but on his exam is certainly looks like that may be the case due to the discomfort that he had. This may also just be discomfort from the exam but really not sure. Due to his history I am going to treat him with diltiazem ointment am also going to give him some Anusol suppositories as certainly he could have some hemorrhoidal disease as the source of this as well. I will have him see Dr. Johna Sheriff in 1-2 weeks.

## 2012-02-07 ENCOUNTER — Encounter (INDEPENDENT_AMBULATORY_CARE_PROVIDER_SITE_OTHER): Payer: Medicare Other | Admitting: General Surgery

## 2012-02-14 ENCOUNTER — Encounter (INDEPENDENT_AMBULATORY_CARE_PROVIDER_SITE_OTHER): Payer: Self-pay | Admitting: General Surgery

## 2012-02-20 ENCOUNTER — Encounter (INDEPENDENT_AMBULATORY_CARE_PROVIDER_SITE_OTHER): Payer: Medicare Other | Admitting: General Surgery

## 2012-02-21 ENCOUNTER — Ambulatory Visit (INDEPENDENT_AMBULATORY_CARE_PROVIDER_SITE_OTHER): Payer: Medicare Other | Admitting: Surgery

## 2012-02-21 DIAGNOSIS — K5900 Constipation, unspecified: Secondary | ICD-10-CM

## 2012-02-21 NOTE — Progress Notes (Signed)
No show  I opened chart by mistake.

## 2013-08-19 ENCOUNTER — Ambulatory Visit: Payer: Medicare Other | Admitting: Dietician

## 2013-11-05 ENCOUNTER — Ambulatory Visit: Payer: Medicare Other | Admitting: Dietician

## 2013-12-24 ENCOUNTER — Encounter: Payer: Medicare Other | Attending: Family Medicine | Admitting: Dietician

## 2013-12-24 ENCOUNTER — Encounter: Payer: Self-pay | Admitting: Dietician

## 2013-12-24 VITALS — Ht 67.0 in | Wt 180.7 lb

## 2013-12-24 DIAGNOSIS — I10 Essential (primary) hypertension: Secondary | ICD-10-CM | POA: Diagnosis not present

## 2013-12-24 DIAGNOSIS — E663 Overweight: Secondary | ICD-10-CM | POA: Insufficient documentation

## 2013-12-24 DIAGNOSIS — Z6828 Body mass index (BMI) 28.0-28.9, adult: Secondary | ICD-10-CM | POA: Insufficient documentation

## 2013-12-24 DIAGNOSIS — Z713 Dietary counseling and surveillance: Secondary | ICD-10-CM | POA: Diagnosis not present

## 2013-12-24 NOTE — Progress Notes (Signed)
  Medical Nutrition Therapy:  Appt start time: 1545 end time:  1630.   Assessment:  Primary concerns today: Maxwell Gardner is here today since he wants to eat better. Blood pressure has been high and trying to have it go lower.   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 Skips breakfast most days and sometimes supper. Most of his meals are from take out or fast food or will eat TV dinners. Has been eating the same way for awhile.   Has tried cooking before but it hasn't worked out. Wants to start to eating healthier and lose weight. Would like to lose 10-15 lbs. Has gained some weight recently.   Preferred Learning Style:   No preference indicated   Learning Readiness:   Contemplating  MEDICATIONS: see list   DIETARY INTAKE:  Usual eating pattern includes 2 meals and 1 snacks per day.  Avoided foods include: onions, tomatoes, coleslaw, seafood   24-hr recall:  B ( AM):none or McDonald's 2 for 3 egg, sausage and cheese biscuits with tea Snk ( AM): none  L ( PM): tacos from [employer] or [employer] from fast food with fries (large) with tea or soda  Snk ( PM): none D ( PM): fast food meal or can of chili with beans or chicken salad sandwich Snk ( PM): oreo cookies (whole package) and milk or apple pie or cake Beverages: tea, sometimes soda, a lot of water, some milk  Usual physical activity: plays basketball 3 x week for 60+ hours  Estimated energy needs: 2200 calories 248 g carbohydrates 165 g protein 61 g fat  Progress Towards Goal(s):  In progress.   Nutritional Diagnosis:  Evans-3.3 Overweight/obesity As related to excess fast food meals .  As evidenced by BMI of 28.3 and hypertension.    Intervention:  Nutrition counseling provided. Plan: Eat 3 meals per day and have 2 snacks if you are hungry. For snacks have protein with carbs at the same time (fruit with eggs, nuts, lean meat or peanut butter/cheese on crackers).  For breakfast make an egg a sandwich or boiled egg with  oatmeal. Try [place] sausage or hot dog.  Limit anything fried or crispy (too high in fat and salt).  Add vegetables to lunch and dinner meals (frozen or bagged salad).  For dinner try a rotisserie chicken that is already cooked. Limit sugary snacks. If don't have them at home, you can't eat them.  Drink mostly water or try G2 or Powerade zero or unsweet tea with splenda.   Teaching Method Utilized:  Visual Auditory Hands on  Handouts given during visit include:  MyPlate Handout  15 g CHO Snacks  1800 Calorie Meal Plan  Barriers to learning/adherence to lifestyle change: does not know how to put food together from grocery store, eats all meals out  Demonstrated degree of understanding via:  Teach Back   Monitoring/Evaluation:  Dietary intake, exercise,  and body weight in 2 month(s).

## 2013-12-24 NOTE — Patient Instructions (Addendum)
Eat 3 meals per day and have 2 snacks if you are hungry. For snacks have protein with carbs at the same time (fruit with eggs, nuts, lean meat or peanut butter/cheese on crackers).  For breakfast make an egg a sandwich or boiled egg with oatmeal. Try [place] sausage or hot dog.  Limit anything fried or crispy (too high in fat and salt).  Add vegetables to lunch and dinner meals (frozen or bagged salad).  For dinner try a rotisserie chicken that is already cooked. Limit sugary snacks. If don't have them at home, you can't eat them.  Drink mostly water or try G2 or Powerade zero or unsweet tea with splenda.

## 2014-03-01 ENCOUNTER — Ambulatory Visit: Payer: Medicare Other | Admitting: Dietician

## 2016-03-20 ENCOUNTER — Ambulatory Visit: Payer: Medicare Other | Admitting: Dietician

## 2016-04-17 ENCOUNTER — Ambulatory Visit: Payer: Medicare Other | Admitting: Dietician

## 2016-04-18 ENCOUNTER — Ambulatory Visit: Payer: Medicare Other | Admitting: Skilled Nursing Facility1

## 2016-05-07 ENCOUNTER — Encounter: Payer: Self-pay | Admitting: Dietician

## 2016-05-07 ENCOUNTER — Encounter: Payer: Medicare Other | Attending: Family Medicine | Admitting: Dietician

## 2016-05-07 DIAGNOSIS — Z713 Dietary counseling and surveillance: Secondary | ICD-10-CM | POA: Diagnosis not present

## 2016-05-07 DIAGNOSIS — E639 Nutritional deficiency, unspecified: Secondary | ICD-10-CM | POA: Diagnosis not present

## 2016-05-07 DIAGNOSIS — E7800 Pure hypercholesterolemia, unspecified: Secondary | ICD-10-CM

## 2016-05-07 DIAGNOSIS — E119 Type 2 diabetes mellitus without complications: Secondary | ICD-10-CM

## 2016-05-07 DIAGNOSIS — K219 Gastro-esophageal reflux disease without esophagitis: Secondary | ICD-10-CM | POA: Diagnosis not present

## 2016-05-07 NOTE — Patient Instructions (Signed)
Take your multivitamin daily. What foods make your stomach hurt?  Avoid these foods.  Coffee may cause your stomach to hurt.   Avoid laying down for 3 hours after you eat. Eat more non starchy vegetables. Use lean meat and bake, broil or grill.  Remove the skin from the chicken.  Choose low fat milk. Use less salad dressing. When shopping, buy lean fresh meat, fresh or frozen vegetables, whole grain bread. Stay as active as possible.  Aim for some form of exercise most days. Continue to take your medication as prescribed. Continue to check your blood sugar.

## 2016-05-07 NOTE — Progress Notes (Signed)
Medical Nutrition Therapy:  Appt start time: 0945 end time:  1100.   Assessment:  Primary concerns today: Patient is here today alone.  He was referred for hypercholesterolemia, GERD, and Nutritional deficiencies.  Patient states that his hemoglobin was low and also stated that he has been diagnosed with diabetes with HgbA1C of 7.7%.  Other hx includes anal fissure, OSA (uses c-pap most days), PTSD, ADD, and Tourette's syndrome. He was seen here in the past (2015) for weight management and lost from 180 lbs to current weight of 169 lbs. He checks his blood sugar once daily (rotates)- 120-140 fasting and 120-184 after meals.  Patient lives alone and states that he does not cook or know how. 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 He works nights on the weekend and a few hours during the week.  Preferred Learning Style:   No preference indicated   Learning Readiness:   Ready   MEDICATIONS: see list to include Omega-3, vitamin D, metamucil, and glimepiride.  He did not tolerate Metformin due to GI side effects.  He states that he will start a MVI this week.   DIETARY INTAKE: Sleeps 8-9 pm-9 am OR when he works 3rd shift he will not sleep Saturday night, sleeps Sunday 10:30-8:30 and starts works at 11 pm and then does not sleep until Monday night.  Usual eating pattern includes 2 meals and 0-1 snacks per day.  24-hr recall:  B (9 AM): 3 boiled eggs OR honey nut cheerios and 2% milk OR rare sausage and gravy biscuit from McDonald's  Snk ( AM): none  L ( PM): Corn or beans or peas OR [place] and chicken cheese sandwich with mayo on [employer] bread Snk ( PM): none D ( PM): none Snk ( PM): popcorn, orange or other fruit Beverages: water, propel (diet) (he has stopped using sugar containing beverages since his diagnosis.)  Usual physical activity: once per week ( gym- treadmill and bike for 1 hour)  Estimated energy needs: 1800 calories 200 g carbohydrates 135 g protein 50 g fat  Progress  Towards Goal(s):  In progress.   Nutritional Diagnosis:  NB-1.1 Food and nutrition-related knowledge deficit As related to dibetes, high cholesterol, and GERD.  As evidenced by patient report and diet hx.    Intervention:  Nutrition counseling/education for GERD, hypercholesterolemia, and diabetes.  Discussed choosing unsaturated fats rather than saturated fats, discussed foods that may trigger GERD as well as lifestyle, discussed basic physiology of type 2 diabetes, carbohydrates, portion size, basic cooking techniques, and healthier choices when eating out.  Discussed balanced eating to improve nutritional status.  Take your multivitamin daily. What foods make your stomach hurt?  Avoid these foods.  Coffee may cause your stomach to hurt.   Avoid laying down for 3 hours after you eat. Eat more non starchy vegetables. Use lean meat and bake, broil or grill.  Remove the skin from the chicken.  Choose low fat milk. Use less salad dressing. When shopping, buy lean fresh meat, fresh or frozen vegetables, whole grain bread. Stay as active as possible.  Aim for some form of exercise most days. Continue to take your medication as prescribed. Continue to check your blood sugar.  Teaching Method Utilized:  Visual Auditory Hands on  Handouts given during visit include:  Copies from Living Well with Diabetes (including label reading and A1C sheet  Snack list  GERD nutrition therapy from [facility]  TLC (heart healthy) nutrition therapy from [facility]  Dining out with diabetes  Making healthy fast food choices  My plate  Barriers to learning/adherence to lifestyle change: limited support and limited cooking knowledge  Demonstrated degree of understanding via:  Teach Back   Monitoring/Evaluation:  Dietary intake, exercise, and body weight in 1 month(s).

## 2016-06-04 ENCOUNTER — Ambulatory Visit: Payer: Medicare Other | Admitting: Dietician
